# Patient Record
Sex: Female | Born: 1995 | Race: Black or African American | Hispanic: No | Marital: Single | Smoking: Never smoker
Health system: Southern US, Community
[De-identification: ages and names within clinical notes are randomized; demographics above are authoritative.]

## PROBLEM LIST (undated history)

## (undated) DIAGNOSIS — J45909 Unspecified asthma, uncomplicated: Secondary | ICD-10-CM

---
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
## 2014-05-28 ENCOUNTER — Emergency Department (HOSPITAL_COMMUNITY)
Admission: EM | Admit: 2014-05-28 | Discharge: 2014-05-28 | Disposition: A | Discharge status: Home / Self Care | Payer: BLUE CROSS/BLUE SHIELD | Attending: Emergency Medicine | Admitting: Emergency Medicine

## 2014-05-28 ENCOUNTER — Encounter (HOSPITAL_COMMUNITY): Payer: Self-pay

## 2014-05-28 DIAGNOSIS — Z79899 Other long term (current) drug therapy: Secondary | ICD-10-CM | POA: Diagnosis not present

## 2014-05-28 DIAGNOSIS — R21 Rash and other nonspecific skin eruption: Secondary | ICD-10-CM | POA: Diagnosis not present

## 2014-05-28 DIAGNOSIS — J029 Acute pharyngitis, unspecified: Secondary | ICD-10-CM | POA: Diagnosis present

## 2014-05-28 DIAGNOSIS — J069 Acute upper respiratory infection, unspecified: Secondary | ICD-10-CM | POA: Insufficient documentation

## 2014-05-28 LAB — RAPID STREP SCREEN (MED CTR MEBANE ONLY): Streptococcus, Group A Screen (Direct): NEGATIVE

## 2014-05-28 NOTE — ED Provider Notes (Signed)
CSN: [id number]     Arrival date & time 05/28/14  1458 History   First MD Initiated Contact with Patient 05/28/14 1523     Chief Complaint  Patient presents with  . Rash  . Sore Throat     (Consider location/radiation/quality/duration/timing/severity/associated sxs/prior Treatment) HPI   19 year old female presents c/o sore throat and rash.  Pt sts for the past month she has noticed itchy rash initially to her upper chest which spread to her upper back and also to her face.  She has been seen by a provider at a health clinic for this rash and was prescribed antifungal medication and steroid which has helped but it has not fully resolved.  Rash is non painful and there's no associated fever, headache, or cp/sob.  2 days ago she developed throat irritation and swollen lymph nodes to her neck.  Sore throat worsening with swallowing but has since improved.  Report mild nasal congestion and occasional non productive cough.  She report changes in her soap and detergent in the interim and does not know if it causes her rash.  She is here primarily concerning about the prolonged duration of her rash.  Denies abd cramping, n/v/d.    No past medical history on file. No past surgical history on file. No family history on file. History  Substance Use Topics  . Smoking status: Never Smoker   . Smokeless tobacco: Not on file  . Alcohol Use: No   OB History    No data available     Review of Systems  All other systems reviewed and are negative.     Allergies  Review of patient's allergies indicates no known allergies.  Home Medications   Prior to Admission medications   Medication Sig Start Date End Date Taking? Authorizing Provider  ibuprofen (ADVIL,MOTRIN) 200 MG tablet Take 200 mg by mouth every 6 (six) hours as needed for moderate pain.    Historical Provider, MD  medroxyPROGESTERone (DEPO-PROVERA) 150 MG/ML injection Inject 150 mg into the muscle every 3 (three) months.    Historical  Provider, MD  valACYclovir (VALTREX) 500 MG tablet Take 500 mg by mouth daily.    Historical Provider, MD   There were no vitals taken for this visit. Physical Exam  Constitutional: She appears well-developed and well-nourished. No distress.  HENT:  Head: Atraumatic.  Throat: uvula midline, no tonsillar enlargement or exudates.  Mild post oropharyngeal erythema.  No PTA.  No trismus  Eyes: Conjunctivae are normal.  Neck: Normal range of motion. Neck supple.  Mild tenderness to L cervical lymph chain without obvious lymphadenopathy.    Cardiovascular: Normal rate and regular rhythm.   Pulmonary/Chest: Effort normal and breath sounds normal. No respiratory distress. She has no wheezes.  Abdominal: Soft. There is no tenderness.  Lymphadenopathy:    She has no cervical adenopathy.  Neurological: She is alert.  Skin: No rash (faint fine papular rash to upper chest and face without cellulitis or abscess noted. no erythema) noted.  Psychiatric: She has a normal mood and affect.  Nursing note and vitals reviewed.   ED Course  Procedures (including critical care time)  Pt with nonspecific rash to upper chest, upper back and face.  Does not look infected.  No systemic involvement.  Recommend continuing with clotrimazole and steroid previously prescribed.  She also report sore throat.  No evidence of deep tissue infection.  Suspect viral.  Will obtain rapid strep test.    4:18 PM Strep negative.  Reassurance given.  Recommend gargle with salt water and using OTC medication.  Return precaution discussed.   Labs Review Labs Reviewed  RAPID STREP SCREEN  CULTURE, GROUP A STREP    Imaging Review No results found.   EKG Interpretation None      MDM   Final diagnoses:  URI (upper respiratory infection)  Rash and nonspecific skin eruption    BP 121/64 mmHg  Pulse 74  Temp(Src) 99 F (37.2 C) (Oral)  SpO2 97%     Fayrene HelperBowie Fionnuala Hemmerich, PA-C 05/29/14 0008  Tilden FossaElizabeth Rees,  MD 05/31/14 1453

## 2014-05-28 NOTE — ED Notes (Signed)
She reports a rash at lower neck/upper chest area a few days ago which was dx as "fungus" by her pcp and responded well to the prescribed med.  Today she c/o rash between and below breasts; and a feeling of "soreness" at left lat. Neck area.

## 2014-05-28 NOTE — Discharge Instructions (Signed)
Viral Exanthems, Adult °Many viral infections of the skin are called viral exanthems. Exanthem is another name for a rash or skin eruption. The most common viral exanthems include the following: °· German measles or rubella. °· Measles or rubeola. °· Roseola. °· Parvovirus B19 (Erythema infectiosum or Fifth disease). °· Chickenpox or varicella. °DIAGNOSIS  °Sometimes, other problems may cause a rash that looks like a viral exanthem. Most often, your caregiver can determine whether you have a viral exanthem by looking at the rash. They usually have distinct patterns or appearance. Lab work may be done if the diagnosis is uncertain. Sometimes, a small tissue sample (biopsy) of the rash may need to be taken. °TREATMENT  °Immunizations have led to a decrease in the number of cases of measles, mumps, and rubella. Viral exanthems may require clinical treatment if a bacterial infection or other problems follow. The rash may be associated with: °· Minor sore throat. °· Aches and pains. °· Runny nose. °· Watery eyes. °· Tiredness. °· Some coughs. °· Gastrointestinal infections causing nausea, vomiting, and diarrhea. °Viral exanthems do not respond to antibiotic medicines, because they are not caused by bacteria. °HOME CARE INSTRUCTIONS  °· Only take over-the-counter or prescription medicines for pain, discomfort, diarrhea, or fever as directed by your caregiver. °· Drink enough water and fluids to keep your urine clear or pale yellow. °SEEK MEDICAL CARE IF: °· You develop swollen neck glands. This may feel like lumps or bumps in the neck. °· You develop tenderness over your sinuses. °· You are not feeling partly better after 3 days. °· You develop muscle aches. °· You are feeling more tired than you would expect. °· You get a persistent cough with mucus. °SEEK IMMEDIATE MEDICAL CARE IF:  °· You have a fever. °· You develop red eyes or eye pain. °· You develop sores in your mouth and difficulty drinking or eating. °· You  develop a sore throat with pus and difficulty swallowing. °· You develop neck pain or a stiff neck. °· You develop a severe headache. °· You develop vomiting that will not stop. °Document Released: 04/19/2002 Document Revised: 04/21/2011 Document Reviewed: 04/16/2010 °[employer]® Patient Information ©2015 [employer]. This information is not intended to replace advice given to you by your health care provider. Make sure you discuss any questions you have with your health care provider. ° °

## 2014-05-30 LAB — CULTURE, GROUP A STREP

## 2015-11-20 ENCOUNTER — Encounter (HOSPITAL_COMMUNITY): Payer: Self-pay | Admitting: Emergency Medicine

## 2015-11-20 ENCOUNTER — Emergency Department (HOSPITAL_COMMUNITY): Payer: Self-pay

## 2015-11-20 ENCOUNTER — Emergency Department (HOSPITAL_COMMUNITY)
Admission: EM | Admit: 2015-11-20 | Discharge: 2015-11-20 | Disposition: A | Discharge status: Home / Self Care | Payer: Self-pay | Attending: Emergency Medicine | Admitting: Emergency Medicine

## 2015-11-20 DIAGNOSIS — S9031XA Contusion of right foot, initial encounter: Secondary | ICD-10-CM

## 2015-11-20 DIAGNOSIS — Y929 Unspecified place or not applicable: Secondary | ICD-10-CM | POA: Insufficient documentation

## 2015-11-20 DIAGNOSIS — Y999 Unspecified external cause status: Secondary | ICD-10-CM | POA: Insufficient documentation

## 2015-11-20 DIAGNOSIS — W228XXA Striking against or struck by other objects, initial encounter: Secondary | ICD-10-CM | POA: Insufficient documentation

## 2015-11-20 DIAGNOSIS — S91311A Laceration without foreign body, right foot, initial encounter: Secondary | ICD-10-CM

## 2015-11-20 DIAGNOSIS — Y939 Activity, unspecified: Secondary | ICD-10-CM | POA: Insufficient documentation

## 2015-11-20 MED ORDER — IBUPROFEN 200 MG PO TABS: 200.0000 mg | ORAL_TABLET | Freq: Four times a day (QID) | ORAL | 0 refills | Status: DC | PRN

## 2015-11-20 MED ORDER — LIDOCAINE HCL (PF) 1 % IJ SOLN
5.0000 mL | Freq: Once | INTRAMUSCULAR | Status: AC
  Administered 2015-11-20: 5 mL
  Filled 2015-11-20: qty 5

## 2015-11-20 NOTE — ED Provider Notes (Signed)
X-ray reviewed.  No fracture.  Patient is being discharged with laceration care instructions cryotherapy for contusion.  Follow-up in 10 days for suture removal   Earley FavorGail Vilas Edgerly, NP 11/20/15 0246    Earley FavorGail Ritisha Deitrick, NP 11/20/15 0246

## 2015-11-20 NOTE — ED Provider Notes (Signed)
[facility]-EMERGENCY DEPT Provider Note   CSN: [id number] Arrival date & time: 11/20/15  0009     History   Chief Complaint Chief Complaint  Patient presents with  . Laceration    HPI Carol Bishop is a 20 y.o. female who presents to the ED for a laceration of her foot. The laceration is located on the dorsum of the right foot. Patient reports she dropped a pole on her foot. She complains of pain to the top of her foot in addition to the laceration. The injury happened just prior to arrival to the ED. She denies other injuries or problems.   HPI  History reviewed. No pertinent past medical history.  Patient Active Problem List   Diagnosis Date Noted  . Major depression 10/29/2013  . Overdose 10/29/2013    History reviewed. No pertinent surgical history.  OB History    No data available       Home Medications    Prior to Admission medications   Medication Sig Start Date End Date Taking? Authorizing Provider  ibuprofen (ADVIL,MOTRIN) 200 MG tablet Take 200 mg by mouth every 6 (six) hours as needed for moderate pain.    Historical Provider, MD  medroxyPROGESTERone (DEPO-PROVERA) 150 MG/ML injection Inject 150 mg into the muscle every 3 (three) months.    Historical Provider, MD  valACYclovir (VALTREX) 500 MG tablet Take 500 mg by mouth daily.    Historical Provider, MD    Family History History reviewed. No pertinent family history.  Social History Social History  Substance Use Topics  . Smoking status: Never Smoker  . Smokeless tobacco: Never Used  . Alcohol use No     Allergies   Review of patient's allergies indicates no known allergies.   Review of Systems Review of Systems Negative except as stated in HPI  Physical Exam Updated Vital Signs BP 121/65 (BP Location: Left Arm)   Pulse 95   Temp 98.7 F (37.1 C) (Oral)   Resp 17   Ht 5\' 6"  (1.676 m)   Wt 80.3 kg   LMP 10/27/2015   SpO2 98%   BMI 28.57 kg/m   Physical Exam  Constitutional: She  is oriented to person, place, and time. She appears well-developed and well-nourished. No distress.  HENT:  Head: Normocephalic.  Eyes: EOM are normal.  Neck: Neck supple.  Cardiovascular: Normal rate.   Pulmonary/Chest: Effort normal.  Abdominal: Soft. There is no tenderness.  Musculoskeletal: Normal range of motion.       Right foot: There is tenderness and laceration. There is normal range of motion, no swelling, normal capillary refill and no deformity.       Feet:  Neurological: She is alert and oriented to person, place, and time. No cranial nerve deficit.  Skin: Skin is warm and dry.  Psychiatric: She has a normal mood and affect. Her behavior is normal.  Nursing note and vitals reviewed.    ED Treatments / Results  Labs (all labs ordered are listed, but only abnormal results are displayed) Labs Reviewed - No data to display  Radiology No results found.  Procedures .Marland KitchenLaceration Repair Date/Time: 11/20/2015 1:03 AM Performed by: Janne Napoleon Authorized by: Janne Napoleon   Consent:    Consent obtained:  Verbal   Consent given by:  Patient   Risks discussed:  Need for additional repair, poor cosmetic result and infection   Alternatives discussed:  No treatment Anesthesia (see MAR for exact dosages):    Anesthesia method:  Local  infiltration   Local anesthetic:  Lidocaine 1% w/o epi Laceration details:    Location:  Foot   Foot location:  Top of R foot   Length (cm):  2 Repair type:    Repair type:  Simple Pre-procedure details:    Preparation:  Patient was prepped and draped in usual sterile fashion and imaging obtained to evaluate for foreign bodies Exploration:    Hemostasis achieved with:  Direct pressure   Wound exploration: wound explored through full range of motion     Wound extent: no nerve damage noted and no tendon damage noted     Contaminated: no   Treatment:    Area cleansed with:  Betadine and saline   Amount of cleaning:  Standard    Irrigation solution:  Sterile saline   Irrigation volume:  30ccs   Irrigation method:  Syringe Skin repair:    Repair method:  Sutures   Suture size:  4-0   Suture material:  Prolene   Suture technique:  Simple interrupted   Number of sutures:  4 Approximation:    Approximation:  Close   Vermilion border: well-aligned   Post-procedure details:    Dressing:  Antibiotic ointment and sterile dressing   Patient tolerance of procedure:  Tolerated well, no immediate complications   (including critical care time)  Medications Ordered in ED Medications  lidocaine (PF) (XYLOCAINE) 1 % injection 5 mL (5 mLs Infiltration Given 11/20/15 0046)     Initial Impression / Assessment and Plan / ED Course  I have reviewed the triage vital signs and the nursing notes.  Pertinent  imaging results that were available during my care of the patient were reviewed by me and considered in my medical decision making (see chart for details).  Clinical Course    Final Clinical Impressions(s) / ED Diagnoses  20 y.o. female with pain and laceration to the dorsum of the right foot awaiting x-ray. Care turned over to Sharen HonesGail Schultz NP @ [phone].   Final diagnoses:  Laceration of right foot, initial encounter  Contusion of right foot, initial encounter    New Prescriptions New Prescriptions   No medications on file     [facility] Neese, NP 11/20/15 0134    Laurence Spatesachel Morgan Little, MD 11/21/15 1902

## 2015-11-20 NOTE — ED Notes (Signed)
Patient transported to X-ray 

## 2015-11-20 NOTE — ED Triage Notes (Signed)
Pt presents to ED for assessment of approx 3 cm x 1 cm laceration to the right dorsal side of the foot.  Pt sts a "pole" fell on it.  PNS intact.  Bleeding controlled.

## 2015-11-20 NOTE — Discharge Instructions (Signed)
Wound was sutured.  The sutures should come out in 10 days.  This can be done by her primary care physician urgent care.  Rezulin restart the emergency department, your x-ray is negative for any fracture.  He been given instructions for more cannot give me second on cryotherapy for the next 24 hours

## 2015-11-25 ENCOUNTER — Encounter (HOSPITAL_COMMUNITY): Payer: Self-pay

## 2015-11-25 ENCOUNTER — Emergency Department (HOSPITAL_COMMUNITY)
Admission: EM | Admit: 2015-11-25 | Discharge: 2015-11-25 | Disposition: A | Discharge status: Home / Self Care | Payer: BLUE CROSS/BLUE SHIELD | Attending: Emergency Medicine | Admitting: Emergency Medicine

## 2015-11-25 DIAGNOSIS — W208XXD Other cause of strike by thrown, projected or falling object, subsequent encounter: Secondary | ICD-10-CM | POA: Insufficient documentation

## 2015-11-25 DIAGNOSIS — T148XXA Other injury of unspecified body region, initial encounter: Secondary | ICD-10-CM

## 2015-11-25 DIAGNOSIS — S91311D Laceration without foreign body, right foot, subsequent encounter: Secondary | ICD-10-CM | POA: Insufficient documentation

## 2015-11-25 DIAGNOSIS — Z4802 Encounter for removal of sutures: Secondary | ICD-10-CM | POA: Insufficient documentation

## 2015-11-25 DIAGNOSIS — L089 Local infection of the skin and subcutaneous tissue, unspecified: Secondary | ICD-10-CM

## 2015-11-25 MED ORDER — BACITRACIN ZINC 500 UNIT/GM EX OINT: 1.0000 "application " | TOPICAL_OINTMENT | Freq: Two times a day (BID) | CUTANEOUS | 1 refills | Status: AC

## 2015-11-25 MED ORDER — NAPROXEN 250 MG PO TABS: 250.0000 mg | ORAL_TABLET | Freq: Two times a day (BID) | ORAL | 0 refills | Status: AC

## 2015-11-25 MED ORDER — SULFAMETHOXAZOLE-TRIMETHOPRIM 800-160 MG PO TABS
1.0000 | ORAL_TABLET | Freq: Once | ORAL | Status: AC
  Administered 2015-11-25: 1 via ORAL
  Filled 2015-11-25: qty 1

## 2015-11-25 MED ORDER — SULFAMETHOXAZOLE-TRIMETHOPRIM 800-160 MG PO TABS: 1.0000 | ORAL_TABLET | Freq: Two times a day (BID) | ORAL | 0 refills | Status: AC

## 2015-11-25 NOTE — ED Provider Notes (Signed)
[facility] Provider Note   By signing my name below, I, Earmon PhoenixJennifer Waddell, attest that this documentation has been prepared under the direction and in the presence of Will Elzia Hott, PA-C. Electronically Signed: Earmon PhoenixJennifer Waddell, ED Scribe. 11/25/15. 6:56 PM.    History   Chief Complaint Chief Complaint  Patient presents with  . Foot Injury   The history is provided by the patient and medical records. No language interpreter was used.    HPI Comments:  Carol Bishop is a 20 y.o. female who presents to the Emergency Department complaining of a suspected wound infection of the dorsal right foot that began earlier today. She reports associated swelling, redness and tenderness of the area. Pt reports having sutures placed to the area about one week ago secondary to a pole falling on her foot and lacerating it (pt had on a sandal but denies the shoe was punctured). She reports she has been standing on the foot at work and went out dancing recently and since has had swelling to her foot. She has been taking Ibuprofen for pain but denies taking any today. She denies modifying factors. She denies fever, chills, nausea, vomiting, warmth around the wound, red streaking up the RLE, numbness, tingling or weakness of the right foot or right leg. She states her tetanus vaccination is UTD.   History reviewed. No pertinent past medical history.  Patient Active Problem List   Diagnosis Date Noted  . Major depression 10/29/2013  . Overdose 10/29/2013    History reviewed. No pertinent surgical history.  OB History    No data available       Home Medications    Prior to Admission medications   Medication Sig Start Date End Date Taking? Authorizing Provider  bacitracin ointment Apply 1 application topically 2 (two) times daily. 11/25/15   Everlene FarrierWilliam Rebie Peale, PA-C  ibuprofen (ADVIL,MOTRIN) 200 MG tablet Take 1 tablet (200 mg total) by mouth every 6 (six) hours as needed for moderate pain.  11/20/15   Earley FavorGail Schulz, NP  medroxyPROGESTERone (DEPO-PROVERA) 150 MG/ML injection Inject 150 mg into the muscle every 3 (three) months.    Historical Provider, MD  naproxen (NAPROSYN) 250 MG tablet Take 1 tablet (250 mg total) by mouth 2 (two) times daily with a meal. 11/25/15   Everlene FarrierWilliam Eilee Schader, PA-C  sulfamethoxazole-trimethoprim (BACTRIM DS,SEPTRA DS) 800-160 MG tablet Take 1 tablet by mouth 2 (two) times daily. 11/25/15 12/02/15  Everlene FarrierWilliam Nhat Hearne, PA-C  valACYclovir (VALTREX) 500 MG tablet Take 500 mg by mouth daily.    Historical Provider, MD    Family History History reviewed. No pertinent family history.  Social History Social History  Substance Use Topics  . Smoking status: Never Smoker  . Smokeless tobacco: Never Used  . Alcohol use No     Allergies   Review of patient's allergies indicates no known allergies.   Review of Systems Review of Systems  Constitutional: Negative for chills and fever.  Gastrointestinal: Negative for nausea and vomiting.  Musculoskeletal: Positive for arthralgias.  Skin: Positive for color change and wound.  Neurological: Negative for weakness and numbness.     Physical Exam Updated Vital Signs BP 116/64 (BP Location: Right Arm)   Pulse 92   Temp 99.4 F (37.4 C) (Oral)   Resp 12   LMP 10/27/2015   SpO2 98%   Physical Exam  Constitutional: She is oriented to person, place, and time. She appears well-developed and well-nourished. No distress.  HENT:  Head: Normocephalic and atraumatic.  Eyes: Right  eye exhibits no discharge. Left eye exhibits no discharge.  Cardiovascular: Normal rate, regular rhythm and intact distal pulses.   Bilateral dorsalis pedis and posterior tibialis pulses are intact.  Pulmonary/Chest: Effort normal. No respiratory distress.  Musculoskeletal: Normal range of motion. She exhibits edema and tenderness.  Edema noted overlying dorsum of right foot with erythema overlying laceration. No surrounding erythema. No  red streaking. No area of fluctuance or induration. Evidence of previous clear discharge but no current discharge present. No purulent discharge.   Neurological: She is alert and oriented to person, place, and time. Coordination normal.  Skin: Skin is warm and dry. Capillary refill takes less than 2 seconds. No rash noted. She is not diaphoretic. There is erythema.  Edema noted overlying dorsum of right foot with erythema overlying laceration. No surrounding erythema. No red streaking. No area of fluctuance or induration. Evidence of previous clear discharge but no current discharge present. No purulent discharge.  Psychiatric: She has a normal mood and affect. Her behavior is normal.  Nursing note and vitals reviewed.    ED Treatments / Results  DIAGNOSTIC STUDIES: Oxygen Saturation is 98% on RA, normal by my interpretation.   COORDINATION OF CARE: 6:48 PM- Will remove sutures. Will prescribe antibiotics, provide crutches and topical antibiotic ointment. Advised pt to follow up with PCP. Return precautions discussed. Pt verbalizes understanding and agrees to plan.  Medications  sulfamethoxazole-trimethoprim (BACTRIM DS,SEPTRA DS) 800-160 MG per tablet 1 tablet (1 tablet Oral Given 11/25/15 1902)    Labs (all labs ordered are listed, but only abnormal results are displayed) Labs Reviewed - No data to display  EKG  EKG Interpretation None       Radiology No results found.  Procedures .Suture Removal Date/Time: 11/25/2015 6:49 PM Performed by: Everlene Farrier Authorized by: Everlene Farrier   Consent:    Consent obtained:  Verbal   Consent given by:  Patient Location:    Location:  Lower extremity   Lower extremity location:  Foot   Foot location:  R foot Procedure details:    Wound appearance:  Tender and red   Number of sutures removed:  4 Post-procedure details:    Post-removal:  Antibiotic ointment applied   Patient tolerance of procedure:  Tolerated well, no  immediate complications   (including critical care time)  Medications Ordered in ED Medications  sulfamethoxazole-trimethoprim (BACTRIM DS,SEPTRA DS) 800-160 MG per tablet 1 tablet (1 tablet Oral Given 11/25/15 1902)     Initial Impression / Assessment and Plan / ED Course  I have reviewed the triage vital signs and the nursing notes.  Pertinent labs & imaging results that were available during my care of the patient were reviewed by me and considered in my medical decision making (see chart for details).  Clinical Course    This is a 20 y.o. female who presents to the Emergency Department complaining of a suspected wound infection of the dorsal right foot that began earlier today. She reports associated swelling, redness and tenderness of the area. Pt reports having sutures placed to the area about one week ago secondary to a pole falling on her foot and lacerating it (pt had on a sandal but denies the shoe was punctured). She reports she has been standing on the foot at work and went out dancing recently and since has had swelling to her foot. She has been taking Ibuprofen for pain but denies taking any today.  No fevers. No streaking redness. No purulent drainage.  On exam  the patient is afebrile nontoxic appearing. She has edema overlying the dorsum of her right foot. Overlying the laceration there is some erythema and some evidence of previous clearish discharge. No induration or fluctuance. No purulent discharge. No focal tenderness. She is good capillary refill and good pulses. X-ray of the time of injury was unremarkable. It is hard to tell if the patient is having foot edema because she has been ambulating on her foot more of this is due to some infection. The only area of erythema is overlying the laceration. Stitches were removed. Will start on Bactrim and encouraged his bacitracin ointment. I encouraged her to be nonweightbearing on her foot. She declined a postop shoe. I discussed  signs and symptoms of worsening infection and discussed strict return precautions. I encouraged her to follow-up in 2 days with primary care. She is unable she can return here for follow-up. I advised the patient to follow-up with their primary care provider this week. I advised the patient to return to the emergency department with new or worsening symptoms or new concerns. The patient verbalized understanding and agreement with plan.     Final Clinical Impressions(s) / ED Diagnoses   Final diagnoses:  Wound infection  Visit for suture removal    New Prescriptions Discharge Medication List as of 11/25/2015  7:02 PM    START taking these medications   Details  bacitracin ointment Apply 1 application topically 2 (two) times daily., Starting Sun 11/25/2015, Print    naproxen (NAPROSYN) 250 MG tablet Take 1 tablet (250 mg total) by mouth 2 (two) times daily with a meal., Starting Sun 11/25/2015, Print    sulfamethoxazole-trimethoprim (BACTRIM DS,SEPTRA DS) 800-160 MG tablet Take 1 tablet by mouth 2 (two) times daily., Starting Sun 11/25/2015, Until Sun 12/02/2015, Print       I personally performed the services described in this documentation, which was scribed in my presence. The recorded information has been reviewed and is accurate.        Everlene Farrier, PA-C 11/25/15 1926    Charlynne Pander, MD 11/26/15 2041

## 2015-11-25 NOTE — ED Triage Notes (Signed)
Pt stats cut R foot a week ago, received stitches for same. Pt states foot swollen today. Noticed some clear drainage today. Pt states has been active on foot past week. States some rigorous activity since stitches.

## 2015-11-25 NOTE — ED Notes (Signed)
Declined W/C at D/C and was escorted to lobby by RN. 

## 2017-06-09 ENCOUNTER — Emergency Department (HOSPITAL_COMMUNITY)
Admission: EM | Admit: 2017-06-09 | Discharge: 2017-06-09 | Disposition: A | Discharge status: Home / Self Care | Payer: Self-pay | Attending: Emergency Medicine | Admitting: Emergency Medicine

## 2017-06-09 ENCOUNTER — Emergency Department (HOSPITAL_COMMUNITY): Payer: Self-pay

## 2017-06-09 ENCOUNTER — Encounter (HOSPITAL_COMMUNITY): Payer: Self-pay | Admitting: Emergency Medicine

## 2017-06-09 ENCOUNTER — Emergency Department (HOSPITAL_COMMUNITY)
Admission: EM | Admit: 2017-06-09 | Discharge: 2017-06-09 | Disposition: A | Discharge status: Home / Self Care | Payer: BLUE CROSS/BLUE SHIELD | Attending: Emergency Medicine | Admitting: Emergency Medicine

## 2017-06-09 ENCOUNTER — Other Ambulatory Visit: Payer: Self-pay

## 2017-06-09 DIAGNOSIS — Z79899 Other long term (current) drug therapy: Secondary | ICD-10-CM | POA: Insufficient documentation

## 2017-06-09 DIAGNOSIS — Y999 Unspecified external cause status: Secondary | ICD-10-CM | POA: Insufficient documentation

## 2017-06-09 DIAGNOSIS — R600 Localized edema: Secondary | ICD-10-CM | POA: Insufficient documentation

## 2017-06-09 DIAGNOSIS — T148XXA Other injury of unspecified body region, initial encounter: Secondary | ICD-10-CM

## 2017-06-09 DIAGNOSIS — Z23 Encounter for immunization: Secondary | ICD-10-CM | POA: Insufficient documentation

## 2017-06-09 DIAGNOSIS — Y929 Unspecified place or not applicable: Secondary | ICD-10-CM | POA: Insufficient documentation

## 2017-06-09 DIAGNOSIS — S161XXA Strain of muscle, fascia and tendon at neck level, initial encounter: Secondary | ICD-10-CM | POA: Insufficient documentation

## 2017-06-09 DIAGNOSIS — S022XXA Fracture of nasal bones, initial encounter for closed fracture: Secondary | ICD-10-CM | POA: Insufficient documentation

## 2017-06-09 DIAGNOSIS — Y9389 Activity, other specified: Secondary | ICD-10-CM | POA: Insufficient documentation

## 2017-06-09 DIAGNOSIS — Z5321 Procedure and treatment not carried out due to patient leaving prior to being seen by health care provider: Secondary | ICD-10-CM | POA: Insufficient documentation

## 2017-06-09 DIAGNOSIS — S0990XA Unspecified injury of head, initial encounter: Secondary | ICD-10-CM | POA: Insufficient documentation

## 2017-06-09 MED ORDER — ACETAMINOPHEN 325 MG PO TABS: 650.0000 mg | ORAL_TABLET | Freq: Four times a day (QID) | ORAL | 0 refills | Status: AC | PRN

## 2017-06-09 MED ORDER — TETANUS-DIPHTH-ACELL PERTUSSIS 5-2.5-18.5 LF-MCG/0.5 IM SUSP
0.5000 mL | Freq: Once | INTRAMUSCULAR | Status: AC
  Administered 2017-06-09: 0.5 mL via INTRAMUSCULAR
  Filled 2017-06-09: qty 0.5

## 2017-06-09 MED ORDER — IBUPROFEN 400 MG PO TABS: 400.0000 mg | ORAL_TABLET | Freq: Four times a day (QID) | ORAL | 0 refills | Status: AC | PRN
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
## 2017-06-09 NOTE — ED Notes (Signed)
Pt states she is no longer willing to wait to be seen. Encouraged to stay with no success. LWBS at this time.

## 2017-06-09 NOTE — ED Notes (Signed)
Pt in room

## 2017-06-09 NOTE — Discharge Instructions (Addendum)
You may alternate taking Tylenol and Ibuprofen as needed for pain control. You may take 400-600 mg of ibuprofen every 6 hours and [phone] mg of Tylenol every 6 hours. Do not exceed 4000 mg of Tylenol daily as this can lead to liver damage. Also, make sure to take Ibuprofen with meals as it can cause an upset stomach. Do not take other NSAIDs while taking Ibuprofen such as (Aleve, Naprosyn, Aspirin, Celebrex, etc) and do not take more than the prescribed dose as this can lead to ulcers and bleeding in your GI tract. You may use warm and cold compresses to help with your symptoms.   Please follow up with your primary doctor within the next 7-10 days for re-evaluation and further treatment of your symptoms.   You were also given information to follow-up with a ear nose and throat doctor if you develop any breathing problems through your nose.  May call the office and make an appointment for follow-up.  If any of the following occur notify your physician or go to the Hospital Emergency Department:  Increased drowsiness, stupor or loss of consciousness  Restlessness or convulsions (fits)  Paralysis in arms or legs  Temperature above 100 F  Vomiting  Severe headache  Blood or clear fluid dripping from the nose or ears  Stiffness of the neck  Dizziness or blurred vision  Pulsating pain in the eye  Unequal pupils of eye  Personality changes  Any other unusual symptoms PRECAUTIONS  Do not take tranquilizers, sedatives, narcotics or alcohol  Avoid aspirin. Use only acetaminophen (e.g. Tylenol) or ibuprofen (e.g. Advil) for relief of pain. Follow directions on the bottle for dosage.  Use ice packs for comfort  Getting plenty of rest and sleep helps the brain to heal. Do not try to do too much too fast. As you start to feel better, you can slowly and gradually return to your usual routine.  Avoid activities that are physically demanding (e.g., sports, heavy housecleaning, exercising) or require a  lot of thinking or concentration (e.g., working on the computer, playing video games).  Ask your health care professional when you can safely drive a car, ride a bike, or operate heavy equipment.
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
## 2024-02-11 ENCOUNTER — Other Ambulatory Visit: Payer: Self-pay

## 2024-02-11 ENCOUNTER — Ambulatory Visit
Admission: EM | Admit: 2024-02-11 | Discharge: 2024-02-11 | Disposition: A | Discharge status: Home / Self Care | Attending: Internal Medicine | Admitting: Internal Medicine

## 2024-02-11 DIAGNOSIS — J029 Acute pharyngitis, unspecified: Secondary | ICD-10-CM | POA: Insufficient documentation

## 2024-02-11 HISTORY — DX: Unspecified asthma, uncomplicated: J45.909

## 2024-02-11 LAB — POCT RAPID STREP A (OFFICE): Rapid Strep A Screen: NEGATIVE

## 2024-02-11 MED ORDER — AMOXICILLIN 500 MG PO CAPS: 500.0000 mg | ORAL_CAPSULE | Freq: Two times a day (BID) | ORAL | 0 refills | Status: AC

## 2024-02-11 NOTE — ED Triage Notes (Signed)
 C/O sore throat for one week. Patient denies cough, fever, body aches or chills. C/O slight nasal congestion.

## 2024-02-11 NOTE — ED Provider Notes (Signed)
 " [facility]  Note:  This document was prepared using Dragon voice recognition software and may include unintentional dictation errors.  MRN: [id number] DOB: 06-Sep-1995 DATE: 02/11/2024   Subjective:  Chief Complaint:  Chief Complaint  Patient presents with   Sore Throat     HPI: Carol Bishop is a 29 y.o. female presenting for sore throat for one week. Patient reports a sore throat starting on Friday. She states she became concerned when she noticed spots in the back of her throat earlier. She reports taking a diflucan and valtrex with no improvement. She does have a history of HSV, but states she had no improvement with antiviral therapy. She has had slight congestion and post nasal drip.  No known sick contacts.  Denies fever, nausea/vomiting, abdominal pain, cough, otalgia. Endorses sore throat, postnasal drip, congestion. Presents NAD.  Prior to Admission medications  Medication Sig Start Date End Date Taking? Authorizing Provider  valACYclovir (VALTREX) 500 MG tablet Take 500 mg by mouth daily.   Yes [provider]  acetaminophen  (TYLENOL ) 325 MG tablet Take 2 tablets (650 mg total) by mouth every 6 (six) hours as needed. Do not take more than 4000mg  of tylenol  per day 06/09/17   Couture, Cortni S, PA-C  bacitracin  ointment Apply 1 application topically 2 (two) times daily. Patient not taking: Reported on 06/09/2017 11/25/15   Dansie, William, PA-C  ibuprofen  (ADVIL ,MOTRIN ) 400 MG tablet Take 1 tablet (400 mg total) by mouth every 6 (six) hours as needed. 06/09/17   Couture, Cortni S, PA-C  medroxyPROGESTERone (DEPO-PROVERA) 150 MG/ML injection Inject 150 mg into the muscle every 3 (three) months.    [provider]  naproxen  (NAPROSYN ) 250 MG tablet Take 1 tablet (250 mg total) by mouth 2 (two) times daily with a meal. Patient not taking: Reported on 06/09/2017 11/25/15   Dansie, William, PA-C     Allergies[1]  History:   Past Medical History:  Diagnosis  Date   Asthma      History reviewed. No pertinent surgical history.  History reviewed. No pertinent family history.  Social History[2]  Review of Systems  Constitutional:  Negative for fever.  HENT:  Positive for congestion, postnasal drip and sore throat. Negative for ear pain.   Respiratory:  Negative for cough.   Gastrointestinal:  Negative for abdominal pain, nausea and vomiting.     Objective:   Vitals: BP 125/80 (BP Location: Right Arm)   Pulse 84   Temp 99.2 F (37.3 C) (Oral)   Resp 18   LMP 01/28/2024   SpO2 97%   Physical Exam Constitutional:      General: She is not in acute distress.    Appearance: Normal appearance. She is well-developed and overweight. She is not ill-appearing or toxic-appearing.  HENT:     Head: Normocephalic and atraumatic.     Right Ear: Tympanic membrane and ear canal normal.     Left Ear: Tympanic membrane and ear canal normal.     Mouth/Throat:     Pharynx: Uvula midline. No pharyngeal swelling, oropharyngeal exudate or posterior oropharyngeal erythema.     Tonsils: No tonsillar exudate or tonsillar abscesses.     Comments: Questionable tonsil stone of right tonsil Cardiovascular:     Rate and Rhythm: Normal rate and regular rhythm.     Heart sounds: Normal heart sounds.  Pulmonary:     Effort: Pulmonary effort is normal.     Breath sounds: Normal breath sounds.     Comments: Clear  to auscultation bilaterally  Abdominal:     General: Bowel sounds are normal.     Palpations: Abdomen is soft.     Tenderness: There is no abdominal tenderness.  Lymphadenopathy:     Cervical: No cervical adenopathy.     Right cervical: No superficial cervical adenopathy.    Left cervical: No superficial cervical adenopathy.  Skin:    General: Skin is warm and dry.  Neurological:     General: No focal deficit present.     Mental Status: She is alert.  Psychiatric:        Mood and Affect: Mood and affect normal.     Results:   Labs: Results for orders placed or performed during the hospital encounter of 02/11/24 (from the past 24 hours)  POCT rapid strep A     Status: Normal   Collection Time: 02/11/24  7:00 PM  Result Value Ref Range   Rapid Strep A Screen Negative Negative    Radiology: No results found.   UC Course/Treatments:  Procedures: Procedures   Medications Ordered in UC: Medications - No data to display   Assessment and Plan :     ICD-10-CM   1. Acute pharyngitis, unspecified etiology  J02.9 POCT rapid strep A    POCT rapid strep A    POCT mono screen    POCT mono screen     Acute pharyngitis, unspecified etiology Afebrile, nontoxic-appearing, NAD. VSS. DDX includes but not limited to: strep, mono, viral pharyngitis, post nasal drip Strep was negative. Mono was negative. Throat culture is pending. Questionable tonsil stone noted on exam. Patient requesting STD testing as well. Amoxicillin 500mg  BID was prescribed given ongoing symptoms and possible stone. Will adjust treatment plan based on results. Recommend salt water gargles and over-the-counter throat spray/lozenges. Strict ED precautions were given and patient verbalized understanding.   ED Discharge Orders          Ordered    amoxicillin (AMOXIL) 500 MG capsule  2 times daily        02/11/24 1916             PDMP not reviewed this encounter.       [1] No Known Allergies [2]  Social History Tobacco Use   Smoking status: Never   Smokeless tobacco: Never  Vaping Use   Vaping status: Never Used  Substance Use Topics   Alcohol use: Yes    Comment: occasional   Drug use: Not Currently    Frequency: 3.0 times per week    Types: Marijuana     Tarisha Fader P, PA-C 02/11/24 1919  "

## 2024-02-11 NOTE — Discharge Instructions (Addendum)
 Your strep test was negative and your mono test was negative. A throat culture was sent to the lab for further testing.  You will be called with the results of your culture. You were also swabbed for STDs as well.   Recommend salt water gargling and over-the-counter Ibuprofen /Tylenol  as needed for pain if you are not allergic.   I did sent a prescription for Amoxicillin in the meantime. Please take as directed. This is an antibiotic.   Return in 3-4 days if no improvement. Please go directly to the Emergency Department immediately should you begin to have any of the following symptoms: increased pain, persistent fevers, difficulty swallowing, difficulty talking, drooling or difficulty breathing.

## 2024-02-14 LAB — CULTURE, GROUP A STREP (THRC)

## 2024-02-15 ENCOUNTER — Ambulatory Visit (HOSPITAL_COMMUNITY): Payer: Self-pay

## 2024-02-15 LAB — CYTOLOGY, (ORAL, ANAL, URETHRAL) ANCILLARY ONLY
Chlamydia: NEGATIVE
Comment: NEGATIVE
Comment: NORMAL
Neisseria Gonorrhea: NEGATIVE
